# Patient Record
Sex: Male | Born: 1980 | Race: Black or African American | Hispanic: No | Marital: Single | Smoking: Current every day smoker
Health system: Southern US, Community
[De-identification: ages and names within clinical notes are randomized; demographics above are authoritative.]

## PROBLEM LIST (undated history)

## (undated) HISTORY — PX: OTHER SURGICAL HISTORY: SHX169

---

## 2013-04-06 ENCOUNTER — Encounter (HOSPITAL_COMMUNITY): Payer: Self-pay | Admitting: *Deleted

## 2013-04-06 ENCOUNTER — Inpatient Hospital Stay (HOSPITAL_COMMUNITY)
Admission: EM | Admit: 2013-04-06 | Discharge: 2013-04-08 | DRG: 512 | Disposition: A | Discharge status: Home / Self Care | Payer: No Typology Code available for payment source | Attending: Orthopedic Surgery | Admitting: Orthopedic Surgery

## 2013-04-06 ENCOUNTER — Emergency Department (HOSPITAL_COMMUNITY): Payer: No Typology Code available for payment source

## 2013-04-06 DIAGNOSIS — S5290XB Unspecified fracture of unspecified forearm, initial encounter for open fracture type I or II: Principal | ICD-10-CM | POA: Diagnosis present

## 2013-04-06 DIAGNOSIS — F1010 Alcohol abuse, uncomplicated: Secondary | ICD-10-CM | POA: Diagnosis present

## 2013-04-06 DIAGNOSIS — Y92410 Unspecified street and highway as the place of occurrence of the external cause: Secondary | ICD-10-CM

## 2013-04-06 DIAGNOSIS — Y998 Other external cause status: Secondary | ICD-10-CM

## 2013-04-06 DIAGNOSIS — S5292XB Unspecified fracture of left forearm, initial encounter for open fracture type I or II: Secondary | ICD-10-CM

## 2013-04-06 DIAGNOSIS — S51809A Unspecified open wound of unspecified forearm, initial encounter: Secondary | ICD-10-CM | POA: Diagnosis present

## 2013-04-06 LAB — COMPREHENSIVE METABOLIC PANEL
ALT: 17 U/L (ref 0–53)
CO2: 24 mEq/L (ref 19–32)
Calcium: 9.2 mg/dL (ref 8.4–10.5)
Chloride: 102 mEq/L (ref 96–112)
Creatinine, Ser: 1.17 mg/dL (ref 0.50–1.35)
GFR calc Af Amer: >90 mL/min (ref >90)
GFR calc non Af Amer: 80 mL/min — ABNORMAL LOW (ref >90)
Glucose, Bld: 103 mg/dL — ABNORMAL HIGH (ref 70–99)
Sodium: 137 mEq/L (ref 135–145)
Total Bilirubin: 0.4 mg/dL (ref 0.3–1.2)

## 2013-04-06 LAB — CBC WITH DIFFERENTIAL/PLATELET
Eosinophils Relative: 1 % (ref 0–5)
HCT: 40.7 % (ref 39.0–52.0)
Hemoglobin: 14.6 g/dL (ref 13.0–17.0)
Lymphocytes Relative: 30 % (ref 12–46)
Lymphs Abs: 2.9 10*3/uL (ref 0.7–4.0)
MCV: 72.8 fL — ABNORMAL LOW (ref 78.0–100.0)
Monocytes Absolute: 0.3 10*3/uL (ref 0.1–1.0)
RBC: 5.59 MIL/uL (ref 4.22–5.81)
WBC: 9.5 10*3/uL (ref 4.0–10.5)

## 2013-04-06 LAB — URINALYSIS, ROUTINE W REFLEX MICROSCOPIC
Ketones, ur: NEGATIVE mg/dL
Protein, ur: NEGATIVE mg/dL
Urobilinogen, UA: 0.2 mg/dL (ref 0.0–1.0)

## 2013-04-06 LAB — URINE MICROSCOPIC-ADD ON

## 2013-04-06 MED ORDER — SODIUM CHLORIDE 0.9 % IV BOLUS (SEPSIS)
1000.0000 mL | Freq: Once | INTRAVENOUS | Status: AC
Start: 2013-04-06 — End: 2013-04-06
  Administered 2013-04-06: 1000 mL via INTRAVENOUS

## 2013-04-06 MED ORDER — ADULT MULTIVITAMIN W/MINERALS CH
1.0000 | ORAL_TABLET | Freq: Every day | ORAL | Status: DC
  Administered 2013-04-08: 1 via ORAL
  Filled 2013-04-06 (×2): qty 1

## 2013-04-06 MED ORDER — IOHEXOL 300 MG/ML  SOLN
100.0000 mL | Freq: Once | INTRAMUSCULAR | Status: AC | PRN
  Administered 2013-04-06: 100 mL via INTRAVENOUS

## 2013-04-06 MED ORDER — CEFAZOLIN SODIUM 1-5 GM-% IV SOLN
1.0000 g | Freq: Once | INTRAVENOUS | Status: AC
  Administered 2013-04-06: 1 g via INTRAVENOUS
  Filled 2013-04-06: qty 50

## 2013-04-06 MED ORDER — HYDROMORPHONE HCL PF 1 MG/ML IJ SOLN
0.5000 mg | INTRAMUSCULAR | Status: DC | PRN
  Administered 2013-04-06 – 2013-04-07 (×5): 1 mg via INTRAVENOUS
  Filled 2013-04-06 (×5): qty 1

## 2013-04-06 MED ORDER — ONDANSETRON HCL 4 MG/2ML IJ SOLN
4.0000 mg | Freq: Four times a day (QID) | INTRAMUSCULAR | Status: DC | PRN
  Administered 2013-04-07: 4 mg via INTRAVENOUS
  Filled 2013-04-06: qty 2

## 2013-04-06 MED ORDER — TETANUS-DIPHTH-ACELL PERTUSSIS 5-2.5-18.5 LF-MCG/0.5 IM SUSP
0.5000 mL | Freq: Once | INTRAMUSCULAR | Status: AC
  Administered 2013-04-06: 0.5 mL via INTRAMUSCULAR
  Filled 2013-04-06: qty 0.5

## 2013-04-06 MED ORDER — METHOCARBAMOL 100 MG/ML IJ SOLN
500.0000 mg | Freq: Four times a day (QID) | INTRAVENOUS | Status: DC | PRN
  Filled 2013-04-06: qty 5

## 2013-04-06 MED ORDER — METHOCARBAMOL 500 MG PO TABS
500.0000 mg | ORAL_TABLET | Freq: Four times a day (QID) | ORAL | Status: DC | PRN
  Administered 2013-04-07 – 2013-04-08 (×4): 500 mg via ORAL
  Filled 2013-04-06 (×4): qty 1

## 2013-04-06 MED ORDER — VITAMIN C 500 MG PO TABS
1000.0000 mg | ORAL_TABLET | Freq: Every day | ORAL | Status: DC
  Administered 2013-04-08: 1000 mg via ORAL
  Filled 2013-04-06 (×2): qty 2

## 2013-04-06 MED ORDER — SODIUM CHLORIDE 0.9 % IV SOLN: INTRAVENOUS | Status: DC

## 2013-04-06 MED ORDER — FENTANYL CITRATE 0.05 MG/ML IJ SOLN
100.0000 ug | Freq: Once | INTRAMUSCULAR | Status: AC
  Administered 2013-04-06: 100 ug via INTRAVENOUS
  Filled 2013-04-06: qty 2

## 2013-04-06 MED ORDER — ONDANSETRON HCL 4 MG PO TABS
4.0000 mg | ORAL_TABLET | Freq: Four times a day (QID) | ORAL | Status: DC | PRN
  Filled 2013-04-06: qty 1

## 2013-04-06 MED ORDER — HYDROCODONE-ACETAMINOPHEN 5-325 MG PO TABS
1.0000 | ORAL_TABLET | ORAL | Status: DC | PRN
  Administered 2013-04-07: 2 via ORAL
  Filled 2013-04-06: qty 2

## 2013-04-06 MED ORDER — DIPHENHYDRAMINE HCL 25 MG PO CAPS: 25.0000 mg | ORAL_CAPSULE | Freq: Four times a day (QID) | ORAL | Status: DC | PRN

## 2013-04-06 MED ORDER — LACTATED RINGERS IV SOLN
INTRAVENOUS | Status: DC
  Administered 2013-04-07: 04:00:00 via INTRAVENOUS

## 2013-04-06 MED ORDER — DOCUSATE SODIUM 100 MG PO CAPS
100.0000 mg | ORAL_CAPSULE | Freq: Two times a day (BID) | ORAL | Status: DC
  Administered 2013-04-07 – 2013-04-08 (×2): 100 mg via ORAL
  Filled 2013-04-06 (×5): qty 1

## 2013-04-06 MED ORDER — OXYCODONE-ACETAMINOPHEN 5-325 MG PO TABS
1.0000 | ORAL_TABLET | ORAL | Status: DC | PRN
  Administered 2013-04-07 – 2013-04-08 (×6): 2 via ORAL
  Filled 2013-04-06 (×6): qty 2

## 2013-04-06 MED ORDER — CEFAZOLIN SODIUM 1-5 GM-% IV SOLN
1.0000 g | Freq: Three times a day (TID) | INTRAVENOUS | Status: DC
  Administered 2013-04-07: 1 g via INTRAVENOUS
  Filled 2013-04-06 (×4): qty 50
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
## 2013-04-06 NOTE — ED Notes (Signed)
Pt in route to Radiology.
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
## 2013-04-06 NOTE — ED Notes (Signed)
Pt remains in Radiology at this time. 

## 2013-04-07 ENCOUNTER — Encounter (HOSPITAL_COMMUNITY): Payer: Self-pay | Admitting: Anesthesiology

## 2013-04-07 ENCOUNTER — Inpatient Hospital Stay (HOSPITAL_COMMUNITY): Payer: No Typology Code available for payment source | Admitting: Anesthesiology

## 2013-04-07 ENCOUNTER — Encounter (HOSPITAL_COMMUNITY)
Admission: EM | Disposition: A | Discharge status: Home / Self Care | Payer: Self-pay | Source: Home / Self Care | Attending: Orthopedic Surgery

## 2013-04-07 ENCOUNTER — Encounter (HOSPITAL_COMMUNITY): Payer: Self-pay

## 2013-04-07 HISTORY — PX: ORIF RADIAL FRACTURE: SHX5113

## 2013-04-07 LAB — SURGICAL PCR SCREEN
MRSA, PCR: NEGATIVE
Staphylococcus aureus: NEGATIVE

## 2013-04-07 SURGERY — OPEN REDUCTION INTERNAL FIXATION (ORIF) RADIAL FRACTURE
Anesthesia: General | Site: Arm Lower | Laterality: Left | Wound class: Clean

## 2013-04-07 MED ORDER — 0.9 % SODIUM CHLORIDE (POUR BTL) OPTIME
TOPICAL | Status: DC | PRN
  Administered 2013-04-07: 1000 mL

## 2013-04-07 MED ORDER — LACTATED RINGERS IV SOLN
INTRAVENOUS | Status: DC | PRN
  Administered 2013-04-07 (×3): via INTRAVENOUS

## 2013-04-07 MED ORDER — OXYCODONE HCL 5 MG PO TABS: 5.0000 mg | ORAL_TABLET | Freq: Once | ORAL | Status: DC | PRN

## 2013-04-07 MED ORDER — HYDROMORPHONE HCL PF 1 MG/ML IJ SOLN
0.2500 mg | INTRAMUSCULAR | Status: DC | PRN
  Administered 2013-04-07 (×2): 0.5 mg via INTRAVENOUS

## 2013-04-07 MED ORDER — CEFAZOLIN SODIUM 1-5 GM-% IV SOLN
1.0000 g | Freq: Three times a day (TID) | INTRAVENOUS | Status: DC
  Administered 2013-04-07 – 2013-04-08 (×3): 1 g via INTRAVENOUS
  Filled 2013-04-07 (×5): qty 50

## 2013-04-07 MED ORDER — BUPIVACAINE HCL (PF) 0.25 % IJ SOLN: INTRAMUSCULAR | Status: DC | PRN

## 2013-04-07 MED ORDER — OXYCODONE HCL 5 MG/5ML PO SOLN: 5.0000 mg | Freq: Once | ORAL | Status: DC | PRN

## 2013-04-07 MED ORDER — CHLORHEXIDINE GLUCONATE 4 % EX LIQD
60.0000 mL | Freq: Once | CUTANEOUS | Status: DC
  Filled 2013-04-07: qty 60

## 2013-04-07 MED ORDER — HYDROMORPHONE HCL PF 1 MG/ML IJ SOLN
INTRAMUSCULAR | Status: AC
  Administered 2013-04-07: 0.5 mg via INTRAVENOUS
  Filled 2013-04-07: qty 1

## 2013-04-07 MED ORDER — LIDOCAINE HCL 4 % MT SOLN
OROMUCOSAL | Status: DC | PRN
  Administered 2013-04-07: 4 mL via TOPICAL

## 2013-04-07 MED ORDER — SUCCINYLCHOLINE CHLORIDE 20 MG/ML IJ SOLN
INTRAMUSCULAR | Status: DC | PRN
  Administered 2013-04-07: 120 mg via INTRAVENOUS

## 2013-04-07 MED ORDER — CEFAZOLIN SODIUM-DEXTROSE 2-3 GM-% IV SOLR
2.0000 g | INTRAVENOUS | Status: AC
  Administered 2013-04-07: 2 g via INTRAVENOUS
  Filled 2013-04-07: qty 50

## 2013-04-07 MED ORDER — FENTANYL CITRATE 0.05 MG/ML IJ SOLN
INTRAMUSCULAR | Status: DC | PRN
  Administered 2013-04-07: 150 ug via INTRAVENOUS
  Administered 2013-04-07: 100 ug via INTRAVENOUS

## 2013-04-07 MED ORDER — BUPIVACAINE HCL (PF) 0.25 % IJ SOLN
INTRAMUSCULAR | Status: AC
  Filled 2013-04-07: qty 30

## 2013-04-07 MED ORDER — LIDOCAINE HCL (CARDIAC) 20 MG/ML IV SOLN
INTRAVENOUS | Status: DC | PRN
  Administered 2013-04-07: 100 mg via INTRAVENOUS

## 2013-04-07 MED ORDER — MIDAZOLAM HCL 5 MG/5ML IJ SOLN
INTRAMUSCULAR | Status: DC | PRN
  Administered 2013-04-07: 2 mg via INTRAVENOUS

## 2013-04-07 MED ORDER — PROPOFOL 10 MG/ML IV BOLUS
INTRAVENOUS | Status: DC | PRN
  Administered 2013-04-07: 200 mg via INTRAVENOUS

## 2013-04-07 MED ORDER — DEXTROSE 5 % IV SOLN
INTRAVENOUS | Status: DC | PRN
  Administered 2013-04-07: 09:00:00 via INTRAVENOUS

## 2013-04-07 SURGICAL SUPPLY — 62 items
BANDAGE ELASTIC 3 VELCRO ST LF (GAUZE/BANDAGES/DRESSINGS) ×2 IMPLANT
BANDAGE ELASTIC 4 VELCRO ST LF (GAUZE/BANDAGES/DRESSINGS) ×2 IMPLANT
BANDAGE GAUZE ELAST BULKY 4 IN (GAUZE/BANDAGES/DRESSINGS) ×2 IMPLANT
BIT DRILL 2.5X2.75 QC CALB (BIT) ×2 IMPLANT
BIT DRILL CALIBRATED 2.7 (BIT) ×2 IMPLANT
BLADE SURG 15 STRL LF DISP TIS (BLADE) ×2 IMPLANT
BLADE SURG 15 STRL SS (BLADE) ×2
BNDG COHESIVE 4X5 TAN STRL (GAUZE/BANDAGES/DRESSINGS) ×2 IMPLANT
CANISTER SUCTION 2500CC (MISCELLANEOUS) ×2 IMPLANT
CLOTH BEACON ORANGE TIMEOUT ST (SAFETY) ×2 IMPLANT
CORDS BIPOLAR (ELECTRODE) ×4 IMPLANT
COVER LIGHT HANDLE STERIS (MISCELLANEOUS) ×2 IMPLANT
CUFF TOURNIQUET SINGLE 18IN (TOURNIQUET CUFF) ×2 IMPLANT
DRAPE OEC MINIVIEW 54X84 (DRAPES) ×2 IMPLANT
DRAPE SURG 17X23 STRL (DRAPES) ×2 IMPLANT
ELECT REM PT RETURN 9FT ADLT (ELECTROSURGICAL) ×2
ELECTRODE REM PT RTRN 9FT ADLT (ELECTROSURGICAL) ×1 IMPLANT
GAUZE SPONGE 4X4 16PLY XRAY LF (GAUZE/BANDAGES/DRESSINGS) ×4 IMPLANT
GAUZE XEROFORM 5X9 LF (GAUZE/BANDAGES/DRESSINGS) ×2 IMPLANT
GLOVE BIO SURGEON STRL SZ7 (GLOVE) ×4 IMPLANT
GLOVE BIO SURGEON STRL SZ7.5 (GLOVE) ×2 IMPLANT
GLOVE BIO SURGEON STRL SZ8 (GLOVE) ×2 IMPLANT
GLOVE BIOGEL PI IND STRL 8 (GLOVE) ×1 IMPLANT
GLOVE BIOGEL PI INDICATOR 8 (GLOVE) ×1
GOWN SRG XL XLNG 56XLVL 4 (GOWN DISPOSABLE) ×2 IMPLANT
GOWN STRL NON-REIN LRG LVL3 (GOWN DISPOSABLE) ×2 IMPLANT
GOWN STRL NON-REIN XL XLG LVL4 (GOWN DISPOSABLE) ×2
KIT BASIN OR (CUSTOM PROCEDURE TRAY) ×2 IMPLANT
KIT ROOM TURNOVER OR (KITS) ×2 IMPLANT
NS IRRIG 1000ML POUR BTL (IV SOLUTION) ×2 IMPLANT
PACK ORTHO EXTREMITY (CUSTOM PROCEDURE TRAY) ×2 IMPLANT
PAD ARMBOARD 7.5X6 YLW CONV (MISCELLANEOUS) ×2 IMPLANT
PAD CAST 4YDX4 CTTN HI CHSV (CAST SUPPLIES) ×1 IMPLANT
PADDING CAST ABS 3INX4YD NS (CAST SUPPLIES) ×1
PADDING CAST ABS COTTON 3X4 (CAST SUPPLIES) ×1 IMPLANT
PADDING CAST COTTON 4X4 STRL (CAST SUPPLIES) ×1
PLATE LOCK 12H 164 BILAT FIB (Plate) ×2 IMPLANT
PLATE LOCK COMP 7H FOOT (Plate) ×2 IMPLANT
SCREW CORT 3.5X26 (Screw) ×1 IMPLANT
SCREW CORT T15 26X3.5XST LCK (Screw) ×1 IMPLANT
SCREW CORTICAL 3.5MM  16MM (Screw) ×2 IMPLANT
SCREW CORTICAL 3.5MM 16MM (Screw) ×2 IMPLANT
SCREW CORTICAL 3.5MM 18MM (Screw) ×4 IMPLANT
SCREW LOCK CORT STAR 3.5X14 (Screw) ×8 IMPLANT
SCREW LOCK CORT STAR 3.5X16 (Screw) ×2 IMPLANT
SCREW LOCK CORT STAR 3.5X22 (Screw) ×4 IMPLANT
SCREW LOW PROFILE 18MMX3.5MM (Screw) ×2 IMPLANT
SCRUB BETADINE 4OZ XXX (MISCELLANEOUS) ×2 IMPLANT
SOLUTION BETADINE 4OZ (MISCELLANEOUS) ×2 IMPLANT
SPLINT FIBERGLASS 3X35 (CAST SUPPLIES) ×2 IMPLANT
SPLINT FIBERGLASS 4X30 (CAST SUPPLIES) ×4 IMPLANT
SPONGE GAUZE 4X4 12PLY (GAUZE/BANDAGES/DRESSINGS) ×2 IMPLANT
STAPLER VISISTAT 35W (STAPLE) ×4 IMPLANT
SUCTION FRAZIER TIP 10 FR DISP (SUCTIONS) ×2 IMPLANT
SUT PROLENE 3 0 PS 2 (SUTURE) ×4 IMPLANT
SUT VIC AB 2-0 CT1 27 (SUTURE) ×2
SUT VIC AB 2-0 CT1 TAPERPNT 27 (SUTURE) ×2 IMPLANT
SUT VICRYL 4-0 PS2 18IN ABS (SUTURE) ×8 IMPLANT
TOWEL OR 17X24 6PK STRL BLUE (TOWEL DISPOSABLE) ×2 IMPLANT
TOWEL OR 17X26 10 PK STRL BLUE (TOWEL DISPOSABLE) ×2 IMPLANT
TUBING BULK SUCTION (MISCELLANEOUS) ×2 IMPLANT
YANKAUER SUCT BULB TIP NO VENT (SUCTIONS) ×2 IMPLANT

## 2013-04-07 NOTE — Brief Op Note (Signed)
04/06/2013 - 04/07/2013  11:26 AM  PATIENT:  Francisco Hays  32 y.o. male  PRE-OPERATIVE DIAGNOSIS:  left both bone forearm fracture  POST-OPERATIVE DIAGNOSIS:  left both bone forearm fracture  PROCEDURE:  Procedure(s): OPEN REDUCTION INTERNAL FIXATION (ORIF) LEFT BOTH BONE FOREARM FRACTURE WITH COMPOSITE PLATES (Left)  SURGEON:  Surgeon(s) and Role:    * Sharma Covert, MD - Primary    * Dominica Severin, MD - Assisting  PHYSICIAN ASSISTANT: none  ASSISTANTS: gramig  ANESTHESIA:   general  EBL:  Total I/O In: 2050 [I.V.:2050] Out: 50 [Blood:50]  BLOOD ADMINISTERED:none  DRAINS: none   LOCAL MEDICATIONS USED:  NONE  SPECIMEN:  No Specimen  DISPOSITION OF SPECIMEN:  N/A  COUNTS:  YES  TOURNIQUET:   Total Tourniquet Time Documented: Upper Arm (Left) - 54 minutes Total: Upper Arm (Left) - 54 minutes  Upper Arm (Left) - 46 minutes Total: Upper Arm (Left) - 46 minutes   DICTATION: .Other Dictation: Dictation Number [id number]  PLAN OF CARE: Discharge to home after PACU  PATIENT DISPOSITION:  PACU - hemodynamically stable.   Delay start of Pharmacological VTE agent (>24hrs) due to surgical blood loss or risk of bleeding: not applicable

## 2013-04-07 NOTE — Transfer of Care (Signed)
Immediate Anesthesia Transfer of Care Note  Patient: Francisco Hays  Procedure(s) Performed: Procedure(s): OPEN REDUCTION INTERNAL FIXATION (ORIF) LEFT BOTH BONE FOREARM FRACTURE WITH COMPOSITE PLATES (Left)  Patient Location: PACU  Anesthesia Type:General  Level of Consciousness: awake  Airway & Oxygen Therapy: Patient Spontanous Breathing  Post-op Assessment: Report given to PACU RN and Post -op Vital signs reviewed and stable  Post vital signs: Reviewed and stable  Complications: No apparent anesthesia complications

## 2013-04-07 NOTE — Anesthesia Procedure Notes (Signed)
Procedure Name: Intubation Date/Time: 04/07/2013 8:55 AM Performed by: Nicholos Johns Pre-anesthesia Checklist: Patient identified, Timeout performed, Emergency Drugs available, Suction available and Patient being monitored Patient Re-evaluated:Patient Re-evaluated prior to inductionOxygen Delivery Method: Circle system utilized Preoxygenation: Pre-oxygenation with 100% oxygen Intubation Type: IV induction Ventilation: Mask ventilation without difficulty Laryngoscope Size: Mac and 4 Grade View: Grade I Tube type: Oral Tube size: 8.0 mm Number of attempts: 1 Airway Equipment and Method: Stylet

## 2013-04-07 NOTE — Progress Notes (Signed)
R/B/A DISCUSSED WITH PT IN ROOM ON FLOOR.  PT VOICED UNDERSTANDING OF PLAN CONSENT SIGNED DAY OF SURGERY PT SEEN AND EXAMINED PRIOR TO OPERATIVE PROCEDURE/DAY OF SURGERY SITE MARKED. QUESTIONS ANSWERED WILL REMAIN AN INPATIENT FOLLOWING SURGERY

## 2013-04-07 NOTE — Anesthesia Postprocedure Evaluation (Signed)
  Anesthesia Post-op Note  Patient: Francisco Hays  Procedure(s) Performed: Procedure(s): OPEN REDUCTION INTERNAL FIXATION (ORIF) LEFT BOTH BONE FOREARM FRACTURE WITH COMPOSITE PLATES (Left)  Patient Location: PACU  Anesthesia Type:General  Level of Consciousness: awake and alert   Airway and Oxygen Therapy: Patient Spontanous Breathing  Post-op Pain: mild  Post-op Assessment: Post-op Vital signs reviewed, Patient's Cardiovascular Status Stable, Respiratory Function Stable, Patent Airway and No signs of Nausea or vomiting  Post-op Vital Signs: Reviewed and stable  Complications: No apparent anesthesia complications

## 2013-04-07 NOTE — Anesthesia Preprocedure Evaluation (Addendum)
Anesthesia Evaluation  Patient identified by MRN, date of birth, ID band Patient awake    Reviewed: Allergy & Precautions, H&P , NPO status , Patient's Chart, lab work & pertinent test results  History of Anesthesia Complications Negative for: history of anesthetic complications  Airway Mallampati: I  Neck ROM: Full    Dental  (+) Teeth Intact and Dental Advisory Given   Pulmonary Current Smoker,          Cardiovascular negative cardio ROS      Neuro/Psych negative neurological ROS     GI/Hepatic negative GI ROS, (+)     substance abuse  alcohol use and marijuana use,   Endo/Other  negative endocrine ROS  Renal/GU negative Renal ROS     Musculoskeletal negative musculoskeletal ROS (+)   Abdominal   Peds  Hematology negative hematology ROS (+)   Anesthesia Other Findings   Reproductive/Obstetrics                          Anesthesia Physical Anesthesia Plan  ASA: II  Anesthesia Plan: General   Post-op Pain Management:    Induction:   Airway Management Planned: Oral ETT  Additional Equipment:   Intra-op Plan:   Post-operative Plan: Extubation in OR  Informed Consent: I have reviewed the patients History and Physical, chart, labs and discussed the procedure including the risks, benefits and alternatives for the proposed anesthesia with the patient or authorized representative who has indicated his/her understanding and acceptance.   Dental advisory given  Plan Discussed with: CRNA, Anesthesiologist and Surgeon  Anesthesia Plan Comments:         Anesthesia Quick Evaluation

## 2013-04-07 NOTE — Preoperative (Signed)
Beta Blockers   Reason not to administer Beta Blockers:Not Applicable 

## 2013-04-08 MED ORDER — OXYCODONE-ACETAMINOPHEN 10-325 MG PO TABS: 1.0000 | ORAL_TABLET | ORAL | Status: DC | PRN

## 2013-04-08 MED ORDER — CEPHALEXIN 500 MG PO CAPS: 500.0000 mg | ORAL_CAPSULE | Freq: Four times a day (QID) | ORAL | Status: DC

## 2013-04-08 MED ORDER — VITAMIN C 500 MG PO TABS: 500.0000 mg | ORAL_TABLET | Freq: Every day | ORAL | Status: DC

## 2013-04-08 MED ORDER — DOCUSATE SODIUM 100 MG PO CAPS: 100.0000 mg | ORAL_CAPSULE | Freq: Two times a day (BID) | ORAL | Status: DC

## 2013-04-08 NOTE — Progress Notes (Signed)
Patient provided with discharge instructions and follow up information. He is aware of pain control and the importance of taking his antibiotics through the full course. He is going home with his spouse.

## 2013-04-08 NOTE — Progress Notes (Signed)
UR COMPLETED  
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
## 2013-04-09 ENCOUNTER — Encounter (HOSPITAL_COMMUNITY): Payer: Self-pay | Admitting: Orthopedic Surgery
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
## 2022-11-30 ENCOUNTER — Ambulatory Visit (HOSPITAL_COMMUNITY)
Admission: EM | Admit: 2022-11-30 | Discharge: 2022-12-01 | Disposition: A | Discharge status: Home / Self Care | Payer: No Payment, Other | Attending: Psychiatry | Admitting: Psychiatry

## 2022-11-30 ENCOUNTER — Encounter (HOSPITAL_COMMUNITY): Payer: Self-pay | Admitting: Emergency Medicine

## 2022-11-30 DIAGNOSIS — F1290 Cannabis use, unspecified, uncomplicated: Secondary | ICD-10-CM | POA: Diagnosis not present

## 2022-11-30 DIAGNOSIS — Z1152 Encounter for screening for COVID-19: Secondary | ICD-10-CM | POA: Diagnosis not present

## 2022-11-30 DIAGNOSIS — F22 Delusional disorders: Secondary | ICD-10-CM | POA: Diagnosis not present

## 2022-11-30 DIAGNOSIS — F17210 Nicotine dependence, cigarettes, uncomplicated: Secondary | ICD-10-CM | POA: Insufficient documentation

## 2022-11-30 LAB — COMPREHENSIVE METABOLIC PANEL
ALT: 34 U/L (ref 0–44)
AST: 45 U/L — ABNORMAL HIGH (ref 15–41)
Albumin: 4.7 g/dL (ref 3.5–5.0)
Alkaline Phosphatase: 61 U/L (ref 38–126)
Anion gap: 20 — ABNORMAL HIGH (ref 5–15)
BUN: 16 mg/dL (ref 6–20)
CO2: 19 mmol/L — ABNORMAL LOW (ref 22–32)
Calcium: 10 mg/dL (ref 8.9–10.3)
Chloride: 94 mmol/L — ABNORMAL LOW (ref 98–111)
Creatinine, Ser: 1.25 mg/dL — ABNORMAL HIGH (ref 0.61–1.24)
GFR, Estimated: >60 mL/min (ref >60)
Glucose, Bld: 88 mg/dL (ref 70–99)
Potassium: 3.6 mmol/L (ref 3.5–5.1)
Sodium: 133 mmol/L — ABNORMAL LOW (ref 135–145)
Total Bilirubin: 1.3 mg/dL — ABNORMAL HIGH (ref 0.3–1.2)
Total Protein: 8.5 g/dL — ABNORMAL HIGH (ref 6.5–8.1)

## 2022-11-30 LAB — LIPID PANEL
Cholesterol: 196 mg/dL (ref 0–200)
HDL: 87 mg/dL (ref >40)
LDL Cholesterol: 87 mg/dL (ref 0–99)
Total CHOL/HDL Ratio: 2.3 RATIO
Triglycerides: 108 mg/dL (ref <150)
VLDL: 22 mg/dL (ref 0–40)

## 2022-11-30 LAB — HEMOGLOBIN A1C
Hgb A1c MFr Bld: 5.3 % (ref 4.8–5.6)
Mean Plasma Glucose: 105.41 mg/dL

## 2022-11-30 LAB — RESP PANEL BY RT-PCR (RSV, FLU A&B, COVID)  RVPGX2
Influenza A by PCR: NEGATIVE
Influenza B by PCR: NEGATIVE
Resp Syncytial Virus by PCR: NEGATIVE
SARS Coronavirus 2 by RT PCR: NEGATIVE

## 2022-11-30 LAB — POCT URINE DRUG SCREEN - MANUAL ENTRY (I-SCREEN)
POC Amphetamine UR: NOT DETECTED
POC Buprenorphine (BUP): NOT DETECTED
POC Cocaine UR: NOT DETECTED
POC Marijuana UR: POSITIVE — AB
POC Methadone UR: NOT DETECTED
POC Methamphetamine UR: NOT DETECTED
POC Morphine: NOT DETECTED
POC Oxazepam (BZO): NOT DETECTED
POC Oxycodone UR: NOT DETECTED
POC Secobarbital (BAR): NOT DETECTED

## 2022-11-30 LAB — CBC WITH DIFFERENTIAL/PLATELET
Abs Immature Granulocytes: 0.03 10*3/uL (ref 0.00–0.07)
Basophils Absolute: 0 10*3/uL (ref 0.0–0.1)
Basophils Relative: 0 %
Eosinophils Absolute: 0 10*3/uL (ref 0.0–0.5)
Eosinophils Relative: 0 %
HCT: 46.7 % (ref 39.0–52.0)
Hemoglobin: 16.4 g/dL (ref 13.0–17.0)
Immature Granulocytes: 0 %
Lymphocytes Relative: 22 %
Lymphs Abs: 2.4 10*3/uL (ref 0.7–4.0)
MCH: 26.1 pg (ref 26.0–34.0)
MCHC: 35.1 g/dL (ref 30.0–36.0)
MCV: 74.2 fL — ABNORMAL LOW (ref 80.0–100.0)
Monocytes Absolute: 0.8 10*3/uL (ref 0.1–1.0)
Monocytes Relative: 7 %
Neutro Abs: 7.9 10*3/uL — ABNORMAL HIGH (ref 1.7–7.7)
Neutrophils Relative %: 71 %
Platelets: 175 10*3/uL (ref 150–400)
RBC: 6.29 MIL/uL — ABNORMAL HIGH (ref 4.22–5.81)
RDW: 12.9 % (ref 11.5–15.5)
WBC: 11.1 10*3/uL — ABNORMAL HIGH (ref 4.0–10.5)
nRBC: 0 % (ref 0.0–0.2)

## 2022-11-30 LAB — TSH: TSH: 0.895 u[IU]/mL (ref 0.350–4.500)

## 2022-11-30 MED ORDER — ZIPRASIDONE MESYLATE 20 MG IM SOLR: 20.0000 mg | INTRAMUSCULAR | Status: DC | PRN

## 2022-11-30 MED ORDER — MAGNESIUM HYDROXIDE 400 MG/5ML PO SUSP: 30.0000 mL | Freq: Every day | ORAL | Status: DC | PRN

## 2022-11-30 MED ORDER — ALUM & MAG HYDROXIDE-SIMETH 200-200-20 MG/5ML PO SUSP: 30.0000 mL | ORAL | Status: DC | PRN

## 2022-11-30 MED ORDER — ACETAMINOPHEN 325 MG PO TABS: 650.0000 mg | ORAL_TABLET | Freq: Four times a day (QID) | ORAL | Status: DC | PRN

## 2022-11-30 MED ORDER — NICOTINE 21 MG/24HR TD PT24
21.0000 mg | MEDICATED_PATCH | Freq: Every day | TRANSDERMAL | Status: DC
  Administered 2022-12-01: 21 mg via TRANSDERMAL
  Filled 2022-11-30: qty 1

## 2022-11-30 MED ORDER — LORAZEPAM 1 MG PO TABS: 1.0000 mg | ORAL_TABLET | ORAL | Status: DC | PRN

## 2022-11-30 MED ORDER — TRAZODONE HCL 50 MG PO TABS
50.0000 mg | ORAL_TABLET | Freq: Every day | ORAL | Status: DC
  Administered 2022-11-30: 50 mg via ORAL
  Filled 2022-11-30: qty 1
  Filled 2022-11-30: qty 7

## 2022-11-30 MED ORDER — OLANZAPINE 5 MG PO TBDP
5.0000 mg | ORAL_TABLET | Freq: Three times a day (TID) | ORAL | Status: DC | PRN
  Administered 2022-12-01: 5 mg via ORAL
  Filled 2022-11-30: qty 1

## 2022-11-30 MED ORDER — HYDROXYZINE HCL 25 MG PO TABS
25.0000 mg | ORAL_TABLET | Freq: Three times a day (TID) | ORAL | Status: DC | PRN
  Filled 2022-11-30: qty 10

## 2022-11-30 MED ORDER — OLANZAPINE 5 MG PO TABS
5.0000 mg | ORAL_TABLET | Freq: Every day | ORAL | Status: DC
  Administered 2022-11-30: 5 mg via ORAL
  Filled 2022-11-30: qty 7
  Filled 2022-11-30: qty 1

## 2022-11-30 NOTE — ED Notes (Signed)
Pt just came on the unit
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
## 2022-12-01 MED ORDER — OLANZAPINE 10 MG PO TABS: 10.0000 mg | ORAL_TABLET | Freq: Every day | ORAL | 0 refills | Status: AC

## 2022-12-01 MED ORDER — OLANZAPINE 10 MG PO TABS
10.0000 mg | ORAL_TABLET | Freq: Every day | ORAL | Status: DC
  Filled 2022-12-01: qty 7

## 2022-12-01 MED ORDER — TRAZODONE HCL 50 MG PO TABS
50.0000 mg | ORAL_TABLET | ORAL | Status: AC
  Administered 2022-12-01: 50 mg via ORAL
  Filled 2022-12-01: qty 1

## 2022-12-01 MED ORDER — HYDROXYZINE HCL 25 MG PO TABS: 25.0000 mg | ORAL_TABLET | Freq: Three times a day (TID) | ORAL | 0 refills | Status: AC | PRN

## 2022-12-01 MED ORDER — HYDROXYZINE HCL 25 MG PO TABS
25.0000 mg | ORAL_TABLET | ORAL | Status: AC
  Administered 2022-12-01: 25 mg via ORAL
  Filled 2022-12-01: qty 1

## 2022-12-01 MED ORDER — TRAZODONE HCL 50 MG PO TABS: 50.0000 mg | ORAL_TABLET | Freq: Every day | ORAL | 0 refills | Status: AC

## 2022-12-01 NOTE — ED Notes (Signed)
Offered pt breakfast but pt refused.

## 2022-12-01 NOTE — ED Notes (Signed)
Patient A&O x 4, ambulatory. Patient discharged in no acute distress. Patient denied SI/HI, A/VH upon discharge. Patient verbalized understanding of all discharge instructions explained by staff, to include follow up appointments, RX's and safety plan. Patient reported mood 10/10.  Pt belongings returned to patient from locker #  8 intact. Patient escorted to lobby via staff for transport to destination. Safety maintained.

## 2022-12-01 NOTE — ED Notes (Signed)
Patient A&Ox4. Patient denies SI/HI and AVH. Patient denies any physical complaints when asked. No acute distress noted. Support and encouragement provided. Routine safety checks conducted according to facility protocol. Encouraged patient to notify staff if thoughts of harm toward self or others arise. Patient verbalize understanding and agreement. Will continue to monitor for safety.    

## 2022-12-01 NOTE — ED Notes (Signed)
Pt resting at present, no distress noted.  Monitoring for safety.

## 2022-12-01 NOTE — ED Notes (Signed)
Pt awake, alert & responsive, no distress noted.  Pt responding to internal stimuli, talking to doors and walls.  Redirectable.  Monitoring for safety.
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
## 2022-12-01 NOTE — ED Notes (Signed)
Pt is in the bed sleeping. Respirations are even and unlabored. No acute distress noted. Will continue to monitor for safety. 

## 2022-12-01 NOTE — Discharge Instructions (Signed)

## 2022-12-01 NOTE — ED Notes (Signed)
Patient resting quietly in bed with eyes closed. Respirations equal and unlabored, skin warm and dry, NAD. Routine safety checks conducted according to facility protocol. Will continue to monitor for safety.
